# Patient Record
Sex: Male | Born: 1955 | Race: Black or African American | Hispanic: No
Health system: Southern US, Community
[De-identification: ages and names within clinical notes are randomized; demographics above are authoritative.]

---

## 2000-11-08 ENCOUNTER — Emergency Department (HOSPITAL_COMMUNITY): Admission: EM | Admit: 2000-11-08 | Discharge: 2000-11-08 | Payer: Self-pay | Admitting: Emergency Medicine

## 2001-12-15 ENCOUNTER — Emergency Department (HOSPITAL_COMMUNITY): Admission: EM | Admit: 2001-12-15 | Discharge: 2001-12-15 | Payer: Self-pay | Admitting: Emergency Medicine

## 2002-01-17 ENCOUNTER — Emergency Department (HOSPITAL_COMMUNITY): Admission: EM | Admit: 2002-01-17 | Discharge: 2002-01-17 | Payer: Self-pay | Admitting: Emergency Medicine

## 2004-07-09 ENCOUNTER — Encounter: Payer: Self-pay | Admitting: Family Medicine

## 2004-07-25 ENCOUNTER — Emergency Department: Payer: Self-pay | Admitting: Internal Medicine

## 2004-08-07 ENCOUNTER — Encounter: Payer: Self-pay | Admitting: Family Medicine

## 2004-09-06 ENCOUNTER — Encounter: Payer: Self-pay | Admitting: Family Medicine

## 2004-12-05 ENCOUNTER — Emergency Department: Payer: Self-pay | Admitting: Internal Medicine

## 2006-07-01 ENCOUNTER — Ambulatory Visit: Payer: Self-pay | Admitting: Internal Medicine

## 2006-07-07 ENCOUNTER — Encounter (HOSPITAL_BASED_OUTPATIENT_CLINIC_OR_DEPARTMENT_OTHER): Admission: RE | Admit: 2006-07-07 | Discharge: 2006-07-21 | Payer: Self-pay | Admitting: Surgery

## 2006-07-08 ENCOUNTER — Ambulatory Visit: Payer: Self-pay | Admitting: Internal Medicine

## 2006-07-08 ENCOUNTER — Ambulatory Visit: Admission: RE | Admit: 2006-07-08 | Discharge: 2006-07-08 | Payer: Self-pay | Admitting: Surgery

## 2006-07-08 ENCOUNTER — Ambulatory Visit: Payer: Self-pay | Admitting: Vascular Surgery

## 2006-07-08 ENCOUNTER — Encounter: Payer: Self-pay | Admitting: Vascular Surgery

## 2006-11-02 ENCOUNTER — Ambulatory Visit: Payer: Self-pay | Admitting: Internal Medicine

## 2006-11-23 ENCOUNTER — Ambulatory Visit: Payer: Self-pay | Admitting: Internal Medicine

## 2006-11-26 ENCOUNTER — Encounter (INDEPENDENT_AMBULATORY_CARE_PROVIDER_SITE_OTHER): Payer: Self-pay | Admitting: Internal Medicine

## 2006-12-02 ENCOUNTER — Encounter (HOSPITAL_BASED_OUTPATIENT_CLINIC_OR_DEPARTMENT_OTHER): Admission: RE | Admit: 2006-12-02 | Discharge: 2006-12-07 | Payer: Self-pay | Admitting: Internal Medicine

## 2006-12-03 ENCOUNTER — Encounter (INDEPENDENT_AMBULATORY_CARE_PROVIDER_SITE_OTHER): Payer: Self-pay | Admitting: Internal Medicine

## 2006-12-07 ENCOUNTER — Encounter (HOSPITAL_BASED_OUTPATIENT_CLINIC_OR_DEPARTMENT_OTHER): Admission: RE | Admit: 2006-12-07 | Discharge: 2006-12-23 | Payer: Self-pay | Admitting: Surgery

## 2006-12-16 ENCOUNTER — Encounter (INDEPENDENT_AMBULATORY_CARE_PROVIDER_SITE_OTHER): Payer: Self-pay | Admitting: Internal Medicine

## 2006-12-22 ENCOUNTER — Ambulatory Visit: Payer: Self-pay | Admitting: Vascular Surgery

## 2007-01-26 ENCOUNTER — Encounter (INDEPENDENT_AMBULATORY_CARE_PROVIDER_SITE_OTHER): Payer: Self-pay | Admitting: Internal Medicine

## 2007-03-11 ENCOUNTER — Ambulatory Visit: Payer: Self-pay | Admitting: Vascular Surgery

## 2008-04-18 ENCOUNTER — Ambulatory Visit: Payer: Self-pay | Admitting: Vascular Surgery

## 2009-10-30 ENCOUNTER — Ambulatory Visit: Payer: Self-pay | Admitting: Vascular Surgery

## 2010-01-22 ENCOUNTER — Ambulatory Visit: Payer: Self-pay | Admitting: Vascular Surgery

## 2010-02-19 ENCOUNTER — Ambulatory Visit: Payer: Self-pay | Admitting: Vascular Surgery

## 2010-02-26 ENCOUNTER — Ambulatory Visit: Payer: Self-pay | Admitting: Vascular Surgery

## 2010-07-22 NOTE — Consult Note (Signed)
NEW PATIENT CONSULTATION   [name], Ernest  DOB:  1955/09/21                                       12/22/2006  ZOXWR#:[id number]   Patient presents today for evaluation of right leg venous ulcer.  He has  a history of this since 2004.  He does not have any history of deep  venous thrombosis and does not have any history of superficial  thrombophlebitis.  He reports having an evaluation at [facility]  regarding this during that time but had no insurance; therefore, was  unable to undergo treatment.  He did have his venous ulcers healed and  now has a recurrent venous ulcer being treated at the [facility] care and  [facility] with weekly Unna boots.  He reports this has been  improving.   PAST MEDICAL HISTORY:  Significant for elevated blood sugars.  He has  not been treated for this.   He does have a family history of diabetes and hypertension in his  mother.   He is single with two children.  He does smoke several cigars a day and  drinks one beer a day.   His weight is 235 pounds.  He is 6 feet 2 inches tall.   He does have no known allergies.   PHYSICAL EXAMINATION:  A well-developed and well-nourished black male  appearing his stated age of 20.  He has no evidence of venous pathology  in his left leg.  He does have marked swelling and tributary  varicosities in the calf.  Does have chronic changes of venous stasis  disease with open ulceration over the medial aspect of his distal right  calf above his medial malleolus.  This does appear to be superficial and  does appear to be healing.  He does have a 2+ dorsalis pedis pulse.   He underwent hand-held duplex by me showing gross reflux in his  saphenous vein.   He will continue his weekly Unna boot treatment.  I will see him again  in two weeks for a formal duplex to evaluate his deep system.  Plan to  see him and discuss options for laser ablation of his saphenous vein at  that time.   Larina Earthly, M.D.  Electronically Signed   TFE/MEDQ  D:  12/22/2006  T:  12/23/2006  Job:  [id number]   cc:   Jake Shark A. Tanda Rockers, M.D.

## 2010-07-22 NOTE — Assessment & Plan Note (Signed)
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  He has done  extremely well and has the usual  amount of soreness in the medial aspect of his thigh over the ablation  site.  He has mild bruising.  He underwent repeat duplex today and this  shows no new ablation of his great saphenous vein with no evidence of  DVT.     Larina Earthly, M.D.  Electronically Signed   TFE/MEDQ  D:  02/26/2010  T:  02/27/2010  Job:  [id number]

## 2010-07-22 NOTE — Assessment & Plan Note (Signed)
[facility]   NAME:  ABDULAI, BLAYLOCK               ACCOUNT NO.:  [id number]   MEDICAL RECORD NO.:  [id number]      DATE OF BIRTH:  Jun 13, 1955   PHYSICIAN:  Theresia Majors. Tanda Rockers, M.D. VISIT DATE:  12/08/2006                                   OFFICE VISIT   SUBJECTIVE:  Ms. Lelon Perla is a 55 year old man who we are seeing for  recurrent stasis ulcer involving the right medial leg.  In the interim,  he has complained of pain, moderate drainage, but no fever.  He  continues to be ambulatory.   OBJECTIVE:  Blood pressure is 127/67, respirations 16, pulse rate 51,  temperature 97.7.  Inspection of the right lower extremity shows chronic  changes of stasis with hyperpigmentation.  There is an excoriated angry  ulcer on the medial aspect of the right leg which was measured,  photographed and cataloged; please refer to the data entries.  The  dorsalis pedis pulse is 3+ and bounding.  There is associated 3+ edema.  The left lower extremity is unremarkable.The interim culture has shown  Staph species only.   ASSESSMENT:  Recurrent stasis ulcer.   PLAN:  We will resume compression wrap with an Radio broadcast assistant.  We will  reevaluate the patient in 1 week.  The patient will ultimately require a vascular consultation to determine  his candidacy for vein ablation.  The immediate key therapeutic  objective will be to avoid secondary infection, to control the edema and  promote re-epithelialization.  There is no evidence of vascular  compromise or infection at this point.  We have given the patient  opportunity to ask questions; he seems to understand the aforementioned  and indicates that he will be compliant.      Harold A. Tanda Rockers, M.D.  Electronically Signed     HAN/MEDQ  D:  12/08/2006  T:  12/09/2006  Job:  [id number]

## 2010-07-22 NOTE — Procedures (Signed)
LOWER EXTREMITY VENOUS REFLUX EXAM   INDICATION:  Right varicose veins.   EXAM:  Using color-flow imaging and pulse Doppler spectral analysis, the  right common femoral, superficial femoral, popliteal, posterior tibial,  greater and lesser saphenous veins are evaluated.  There is evidence  suggesting deep venous insufficiency in the right lower extremity.   The right saphenofemoral junction is not competent with Reflux of  >578milliseconds. The right GSV is not competent with Reflux of  >56milliseconds with the caliber as described below.   The right proximal short saphenous vein demonstrates competency.   GSV Diameter (used if found to be incompetent only)                                            Right    Left  Proximal Greater Saphenous Vein           0.84 cm  cm  Proximal-to-mid-thigh                     0.87 cm  cm  Mid thigh                                 1.09 cm  cm  Mid-distal thigh                          cm       cm  Distal thigh                              1.14 cm  cm  Knee                                      1.21 cm  cm   IMPRESSION:  1. Right greater saphenous vein is not competent with reflux with      >52milliseconds.  2. The right greater saphenous vein is tortuous in the calf.  3. The deep venous system is not competent with Reflux of      >572milliseconds.  4. The right short saphenous vein is competent.   ___________________________________________  Larina Earthly, M.D.   EM/MEDQ  D:  01/22/2010  T:  01/22/2010  Job:  [id number]

## 2010-07-22 NOTE — Assessment & Plan Note (Signed)
OFFICE VISIT   [name], [name]  DOB:  1955-10-05                                       01/22/2010  WUJWJ#:[id number]   This patient presents today for continued evaluation of extensive venous  hypertension in his right leg.  He has worn thigh-high graduated  compression garments but continues to have some pain, specifically over  his calf and also is developing renewed breakdown over his prior healed  venous ulcer on his right medial malleolus.  His ultrasound reveals  gross reflux throughout his great saphenous vein extending in these  varicosities.   I feel that he has clearly failed conservative treatment and recommended  laser ablation of his great saphenous vein for symptom relief.  He has  several varicosities arising off this and we would recommend stab  phlebectomy at the same setting.  He wished to proceed with this as soon  as we can get him scheduled.  He does have pain which is making it  difficult for him around his home with cooking and shopping due to leg  pain and also has had to stop all lower body workouts and exercise due  to leg pain as well.  He understands this is an outpatient treatment  under local anesthesia and we will proceed at his convenience.     Larina Earthly, M.D.  Electronically Signed   TFE/MEDQ  D:  01/22/2010  T:  01/23/2010  Job:  [id number]

## 2010-07-22 NOTE — Assessment & Plan Note (Signed)
[facility]   NAME:  [name], [name]               ACCOUNT NO.:  [id number]   MEDICAL RECORD NO.:  [id number]      DATE OF BIRTH:  Nov 20, 1955   PHYSICIAN:  Theresia Majors. Tanda Rockers, M.D. VISIT DATE:  12/16/2006                                   OFFICE VISIT   SUBJECTIVE:  [name] is a 55 year old man who we are following for a  stasis ulcer involving the right lower extremity.  In the interim, he  has worn an Radio broadcast assistant.  There has been no excessive drainage, malodor,  pain, or fever.  He continues to be ambulatory.   OBJECTIVE:  Blood pressure is 120/82, respirations 18, pulse rate 50,  temperature 98.5.  Inspection of the right ulcer shows that there has  been decrease in volume as well as area.  The wound is clean.  The edema  has been reasonably controlled.  The changes of chronic venous  insufficiency are persistent.  The dorsalis pedis pulse remains  palpable.   ASSESSMENT:  Clinical improvement of stasis ulcer, responding to  compression.   PLAN:  We are re-applying the Unna boot, and we will reevaluate the  patient in 1 week.      Harold A. Tanda Rockers, M.D.  Electronically Signed     HAN/MEDQ  D:  12/16/2006  T:  12/17/2006  Job:  [id number]

## 2010-07-22 NOTE — Assessment & Plan Note (Signed)
[facility]   NAME:  [name], [name]               ACCOUNT NO.:  [id number]   MEDICAL RECORD NO.:  [id number]      DATE OF BIRTH:  Jul 18, 1955   PHYSICIAN:  Maxwell Caul, M.D. VISIT DATE:  12/03/2006                                   OFFICE VISIT   [name] is re-referred here through [facility] for our review of a  wound on his right medial ankle.  This is a patient who was actually  seen here in May who had a stasis ulceration that responded nicely to  compression therapy.  He was discharged ultimately with a prescription  for graded pressure stockings and a suggestion that he consider a  vascular surgeon for vein surgery.  He was not able to afford either the  prescription graded pressure stockings for consideration of a vascular  surgeon.  He tells me that several weeks ago.  This area opened.  It  would seem that this was starting to form on August 26 when last seen in  [facility].  The he tried to treat this himself with a piece of  somebody's Unna, however, the obviously this is not improving.  He  returns today in followup.   WOUND EXAM:  Temperature is 98.5, pulse 51, respirations 16, blood  pressure 122/71.  He is not a diabetic.  The area on the right medial ankle measures 6.5 x  4.1 x 0.1.  The wound area is a regular with several lakes of  epithelialization.  There was some purulent drainage coming from the  medial aspect of the wound that was cultured.  There is significant  surrounding edema with really tremendous varicosities noted.  There is  discoloration around the wound with a darker pigmented area.  I could  not completely exclude cellulitis.  Area was tender but the tenderness  seemed out of proportion to objective findings.  His peripheral pulses  were well palpable.  There was no evidence of any degree of vascular  insufficiency.   IMPRESSION:  1. Venous stasis ulceration.  We applied Silver shield gel and      nonadherent  dressing, and an Unna wrap.  2. Possibility of periwound cellulitis.  I gave him a prescription for      doxycycline.  A culture the area was done.  I am uncertain whether      there is truly cellulitis here are not.  However, I felt that we      should proceed with giving him an antibiotic to be certain of      treatment while we await cultures.   We will see him back on Tuesday of next week in followup.  Prescription  for doxycycline as noted above.           ______________________________  Maxwell Caul, M.D.     MGR/MEDQ  D:  12/03/2006  T:  12/03/2006  Job:  [phone]

## 2010-07-22 NOTE — Assessment & Plan Note (Signed)
OFFICE VISIT   [name], Ernest  DOB:  1955-07-10                                       02/19/2010  WCBJS#:[id number]   The patient presents today for treatment of his venous hypertension in  his right leg.  He had a laser ablation from just below the knee to just  below the saphenofemoral junction on the right of great saphenous vein  and also had stab phlebectomy of 10 to 20 tributary varicosities in his  pretibial area, medial knee and calf.  He had no immediate  complications.  He will be seen again in 1 week with repeat duplex  follow-up.     Larina Earthly, M.D.  Electronically Signed   TFE/MEDQ  D:  02/19/2010  T:  02/20/2010  Job:  [id number]

## 2010-07-22 NOTE — Assessment & Plan Note (Signed)
OFFICE VISIT   [name], [name]  DOB:  09/02/1955                                       10/30/2009  EAVWU#:[id number]   The patient presents today for continued follow-up of his right leg  venous hypertension.  He had a long history of severe venous  hypertension with ulceration.  He has had excoriation recently over his  medial malleolus and is treating this appropriately with Silvadene.  He  does wear his compression garments intermittently which are knee-high.   PHYSICAL EXAM:  Is otherwise unchanged.  Blood pressure is 125/81,  pulses 62, respirations 18.  HEENT is normal.  He does have palpable  dorsalis pedis pulses bilaterally.  He has no venous pathology on  physical examination in the left leg.  He has marked venous hypertension  on the entire medial aspect of his calf on the right.  He has excoriated  area with prior ulceration.   We have fitted him with thigh-high graduated compression garments 23  mmHg.  Instructed him on continued use of Silvadene over the open wound.  Plan to see him in 3 months for continued discussion.  I did image his  vein with SonoSite and he has a very large incompetent right great  saphenous vein feeding into this.  I felt he would be an excellent  candidate for ablation for improvement of venous hypertension.  We will  see him again in 3 months with follow-up and for a formal duplex.     Larina Earthly, M.D.  Electronically Signed   TFE/MEDQ  D:  10/30/2009  T:  10/31/2009  Job:  [id number]

## 2010-07-22 NOTE — Assessment & Plan Note (Signed)
OFFICE VISIT   [name], Ernest  DOB:  1955-03-27                                       03/11/2007  ONGEX#:[id number]   The patient presents today for continued followup of his severe venous  hypertension, right leg.  He has, fortunately, been able to heal the  ulcer at the medial aspect of his right ankle.  He does have severe  venous hypertension with marked skin changes.  He has worn compression  garments in the past, but was unable to tolerate these due to pain and  pressure.  He has not been wearing Unna boot since the healing of his  ulcer.  He underwent a formal duplex today in our office, which reveals  gross reflux throughout the saphenous vein feeding into the marked  varicosities in his calf.  I discussed options with the patient.  I feel  that he is an excellent candidate for laser ablation of his right  greater saphenous vein and stab phlebectomy of tributaries for reduction  of venous hypertension.  I explained that this would not reverse the  skin changes that he had, but should be able to reduce his risk for  progression of change of venous stasis disease.  He understands and  wishes to proceed.  I have discussed financing due to his status, and we  will proceed once we have arranged coverage with him.   Larina Earthly, M.D.  Electronically Signed   TFE/MEDQ  D:  03/11/2007  T:  03/14/2007  Job:  [id number]

## 2010-07-22 NOTE — Procedures (Signed)
LOWER EXTREMITY VENOUS REFLUX EXAM   INDICATION:  Right lower extremity varicose veins with ulcer.   EXAM:  Using color-flow imaging and pulse Doppler spectral analysis, the  right common femoral, superficial femoral, popliteal, posterior tibial,  greater and lesser saphenous veins are evaluated.  There is evidence  suggesting deep venous insufficiency in the right common femoral vein  and proximal superficial femoral vein.   The right saphenofemoral junction is not competent.  The right greater  saphenous vein is not competent with the caliber as described below.   The right proximal short saphenous vein demonstrates competency.   GSV Diameter (used if found to be incompetent only)                                            Right    Left  Proximal Greater Saphenous Vein           1.01 cm  cm  Proximal-to-mid-thigh                     1.23 cm  cm  Mid thigh                                 0.99 cm  cm  Mid-distal thigh                          0.97 cm  cm  Distal thigh                              0.92 cm  cm  Knee                                      0.93 cm  cm   IMPRESSION:  1. The right greater saphenous vein reflux is identified with the      caliber ranging from 0.92 cm to 1.23 cm knee to groin.  2. The right greater saphenous vein is not aneurysmal.  3. The right greater saphenous vein is not tortuous in the thigh.  4. The deep venous system is competent except for the right common      femoral vein and proximal superficial femoral vein.  5. The right lesser saphenous vein is competent.  6. No evidence of deep venous thrombosis or superficial venous      thrombosis in the right lower extremity.   ___________________________________________  Larina Earthly, M.D.   AS/MEDQ  D:  03/11/2007  T:  03/11/2007  Job:  [id number]

## 2010-07-22 NOTE — Procedures (Signed)
DUPLEX DEEP VENOUS EXAM - LOWER EXTREMITY   INDICATION:  Followup from right greater saphenous vein ablation.   HISTORY:  Edema:  No.  Trauma/Surgery:  Right greater saphenous vein ablation, 02/19/2010.  Pain:  Yes.  PE:  No.  Previous DVT:  No.  Anticoagulants:  No.  Other:   DUPLEX EXAM:                CFV   SFV   PopV  PTV    GSV                R  L  R  L  R  L  R   L  R  L  Thrombosis    o  o  o     o     o      +  Spontaneous   +  +  +     +     +      o  Phasic        +  +  +     +     +      o  Augmentation  +  +  +     +     +      o  Compressible  +  +  +     +     +      o  Competent     +  +  +     +     +      +   Legend:  + - yes  o - no  p - partial  D - decreased   IMPRESSION:  No evidence of acute deep venous thrombosis.  Successful  right greater saphenous vein ablation from the saphenofemoral junction  to the distal insertion site.  I discussed these findings with Dr.  Arbie Cookey.    _____________________________  Larina Earthly, M.D.   OD/MEDQ  D:  02/26/2010  T:  02/26/2010  Job:  [id number]

## 2010-07-22 NOTE — Assessment & Plan Note (Signed)
OFFICE VISIT   [name], [name]  DOB:  05-07-1955                                       04/18/2008  UJWJX#:[id number]   The patient presents today for continued followup of his right leg  venous hypertension.  I had seen him in prior evaluations in October  2008 and January of 2009.  In January of 2009 he underwent noninvasive  vascular laboratory studies which reveals reflux in his great saphenous  vein throughout its course.  He did have incompetence in his right  common femoral vein and proximal superficial femoral vein.  I had  discussed the options of laser ablation and stab phlebectomy and he was  to continue his compression.  Unfortunately since my last visit with him  he is incarcerated.  He sees me today having been transported here from  [facility] for this visit.  He does have a very superficial ulceration  over the medial aspect of his above malleolus ankle on the right.  This  is not infected and is very superficial.  He has been keeping a gauze  over this.  He is wearing very light grade TED stockings.  He has not  worn any true compression.  He does continue to have saphenous  varicosities above this.  I discussed with the patient the mainstay of  treatment would be better compression.  He is written a prescription for  Silvadene and we have given him a pair of knee high 20-30 mmHg graduated  compression garments.  I explained that with him being in [facility]  it is very difficult to proceed with an elective outpatient procedure  with concern regarding potential complications and also followup.  I  have recommended that he continue conservative treatment until he is no  longer incarcerated at which time it will be more convenient for him to  be treated.  I explained that the mainstay of treatment will be  compression for ulcer healing.  He understands this and we will see him  again in 1 year at which time he reports that he will no longer  be  incarcerated.   Larina Earthly, M.D.  Electronically Signed   TFE/MEDQ  D:  04/18/2008  T:  04/19/2008  Job:  [id number]

## 2010-07-25 NOTE — Consult Note (Signed)
NAME:  [name]               ACCOUNT NO.:  [id number]   MEDICAL RECORD NO.:  [id number]          PATIENT TYPE:  REC   LOCATION:  FOOT                         FACILITY:  [facility]   PHYSICIAN:  Harold A. Tanda Rockers, M.D.DATE OF BIRTH:  [birth year]   DATE OF CONSULTATION:  07/08/2006  DATE OF DISCHARGE:                                 CONSULTATION   SUBJECTIVE:  [name] is a 55 year old male who is referred by Dr.  Delrae Alfred from [facility] for evaluation of venous ulcer in the left  leg.   IMPRESSION:  Venous hypertension with stasis ulceration.   RECOMMENDATIONS:  A duplex scan to rule out perforator saphenofemoral  incompetence and to assess the need for surgical intervention.  In the  meantime, we will treat the patient with external compression utilizing  an Radio broadcast assistant.   SUBJECTIVE:  The patient is a 55 year old man who has had discoloration,  swelling, itching, and weeping of the left lower extremity for the past  5-6 years.  This has become progressive over the last several months.  He has lost several jobs due to the fact that his leg swells, itches,  and interferes with his ambulation.  He has trouble with severe pain.  He denies shortness of breath or hemoptysis.  He does not smoke.  He has  been seen on multiple occasions at [facility] with similar complaints.  He has been treated with an Radio broadcast assistant in the past.  He has been  evaluated by the Vascular Service at [facility] and was offered  ablation of the vein by percutaneous technique.  He was unable to  proceed with the surgery due to financial reasons.  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  His past medical history  is remarkable for no known allergies.  His medication includes  hydrocodone 5/550 one to two p.r.n. for pain.  He denies previous  surgery.  His family history is positive for diabetes.  Socially,  he is  divorced.  He has adult children who live locally   REVIEW OF SYSTEMS:  Specifically negative for angina pectoris.  He does  have some dyspepsia prior to and after a big meal.  His weight has been  stable.  He denies polydipsia or polyphagia.  He has no bowel or bladder  complaints.  The remainder of the review of systems is negative.   PHYSICAL EXAMINATION:  GENERAL:  He is an alert, oriented, cooperative  man in no acute distress.  VITAL SIGNS:  His blood pressure is 122/72, respirations 16, pulse rate  50.  Temperature is 97.8.  HEENT:  Clear.  NECK:  Supple.  Trachea is midline.  Thyroid is nonpalpable.  LUNGS:  Clear.  The heart sounds were normal.  The pulse rate on my  repeat exam is 68.  ABDOMEN:  Soft.  EXTREMITIES:  Remarkable for severe changes of stasis involving the  right lower extremity.  On the medial aspect of the lower extremity in  the gaiter  area there is intense desquamation and weeping, but there is  no frank ulceration.  The pedal pulse is 3+ bilaterally.  The patient  retains protective sensation.  There is associated 2+ edema in the right  lower extremity.  The left lower extremity is essentially normal.   DISCUSSION:  This 55 year old man is having significant difficulty  keeping a job due to the symptoms related to venous hypertension,  stasis, cellulitis, and ulceration.  He apparently has been thoroughly  evaluated at [facility] and has been offered surgical intervention  presumably for severe and documented venous insufficiency with reflux.  Initially, we will control his symptoms with external compression.  We  would document his pathology with a  venous duplex.  We will also recommend that he be seen locally by  Vascular Surgery for consideration for venous surgery.  We have  explained this approach to the patient in terms that he seems to  understand.  We are proceeding with a duplex scan prior to placing the  compression wrap.  We will  re-evaluate the patient in 1 week.      Harold A. Tanda Rockers, M.D.  Electronically Signed     HAN/MEDQ  D:  07/08/2006  T:  07/08/2006  Job:  [id number]   cc:   Marcene Duos, M.D.

## 2010-07-25 NOTE — Assessment & Plan Note (Signed)
[facility]   NAME:  [name], [name]               ACCOUNT NO.:  [id number]   MEDICAL RECORD NO.:  [id number]      DATE OF BIRTH:  [birth year]   PHYSICIAN:  Theresia Majors. Tanda Rockers, M.D. VISIT DATE:  07/15/2006                                   OFFICE VISIT   SUBJECTIVE:  [name] is a 55 year old man who we initially saw in  Jul 08, 2006, with a stasis ulcer involving his right lower extremity.  The patient was treated with external compression following a negative  duplex scan, which confirmed the presence of venous insufficiency and  marked venous plethora.  He returns for followup.  He denies interim  pain, swelling, malodorous drainage, or fever.   OBJECTIVE:  Blood pressure is 103/74, respirations 18, pulse rate 62,  temperature 97.9.  Inspection of the right lower extremity shows that  chronic changes of stasis are persistent.  There are prominent  varicosities.  The ulcer, however, is completely resolved.   ASSESSMENT:  Resolved venous stasis ulcer.   PLAN:  We are discharging the patient.  We have given him a consultation  to see the vascular surgeon for consideration of vein surgery.  We have  also provided him with prescriptions for a 30- to 40-mm gradient  stocking.  We have explained the main treatment of venous stasis and  ulceration to the patient in terms that he seems to understand.  He  expresses gratitude for having been seen in this clinic.  The patient is  currently pursuing assistance to social services, so as to promote his  cross-training and on disability secondary to his severe venous disease.  We have indicated that we will assist him within our means.      Harold A. Tanda Rockers, M.D.  Electronically Signed     HAN/MEDQ  D:  07/15/2006  T:  07/15/2006  Job:  [id number]

## 2020-02-11 ENCOUNTER — Other Ambulatory Visit: Payer: Self-pay

## 2020-02-11 ENCOUNTER — Emergency Department (HOSPITAL_COMMUNITY)
Admission: EM | Admit: 2020-02-11 | Discharge: 2020-02-11 | Disposition: A | Discharge status: Home / Self Care | Payer: Medicare Other | Attending: Emergency Medicine | Admitting: Emergency Medicine

## 2020-02-11 ENCOUNTER — Encounter (HOSPITAL_COMMUNITY): Payer: Self-pay

## 2020-02-11 DIAGNOSIS — S46819A Strain of other muscles, fascia and tendons at shoulder and upper arm level, unspecified arm, initial encounter: Secondary | ICD-10-CM

## 2020-02-11 DIAGNOSIS — Y92410 Unspecified street and highway as the place of occurrence of the external cause: Secondary | ICD-10-CM | POA: Diagnosis not present

## 2020-02-11 DIAGNOSIS — M25511 Pain in right shoulder: Secondary | ICD-10-CM | POA: Diagnosis not present

## 2020-02-11 DIAGNOSIS — M25512 Pain in left shoulder: Secondary | ICD-10-CM | POA: Insufficient documentation

## 2020-02-11 DIAGNOSIS — M545 Low back pain, unspecified: Secondary | ICD-10-CM | POA: Diagnosis present

## 2020-02-11 DIAGNOSIS — S39012A Strain of muscle, fascia and tendon of lower back, initial encounter: Secondary | ICD-10-CM

## 2020-02-11 DIAGNOSIS — R030 Elevated blood-pressure reading, without diagnosis of hypertension: Secondary | ICD-10-CM

## 2020-02-11 MED ORDER — ACETAMINOPHEN 500 MG PO TABS
1000.0000 mg | ORAL_TABLET | Freq: Once | ORAL | Status: AC
  Administered 2020-02-11: 1000 mg via ORAL
  Filled 2020-02-11: qty 2

## 2020-02-11 MED ORDER — METHOCARBAMOL 750 MG PO TABS: 750.0000 mg | ORAL_TABLET | Freq: Three times a day (TID) | ORAL | 0 refills | Status: AC | PRN

## 2020-02-11 MED ORDER — METHOCARBAMOL 500 MG PO TABS
750.0000 mg | ORAL_TABLET | Freq: Once | ORAL | Status: AC
  Administered 2020-02-11: 750 mg via ORAL
  Filled 2020-02-11: qty 2
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
## 2020-02-11 NOTE — Discharge Instructions (Addendum)
It was our pleasure to provide your ER care today - we hope that you feel better.  Rest. Try heat therapy or gentle massage to sore area.   Take acetaminophen as need for pain. You may also take robaxin as need for muscle pain/spasm - no driving when taking.   Follow up with primary care doctor this coming week for recheck, and also recheck of blood pressure as it is high tonight.   Return to ER if worse, new symptoms, new or severe pain, numbness/weakness, chest pain, trouble breathing, or other emergency concern.

## 2021-05-17 ENCOUNTER — Encounter (HOSPITAL_COMMUNITY): Payer: Self-pay

## 2021-05-17 ENCOUNTER — Emergency Department (HOSPITAL_COMMUNITY): Payer: Medicare Other

## 2021-05-17 ENCOUNTER — Emergency Department (HOSPITAL_COMMUNITY)
Admission: EM | Admit: 2021-05-17 | Discharge: 2021-05-17 | Disposition: A | Discharge status: Home / Self Care | Payer: Medicare Other | Attending: Emergency Medicine | Admitting: Emergency Medicine

## 2021-05-17 DIAGNOSIS — N390 Urinary tract infection, site not specified: Secondary | ICD-10-CM | POA: Insufficient documentation

## 2021-05-17 DIAGNOSIS — R102 Pelvic and perineal pain: Secondary | ICD-10-CM

## 2021-05-17 LAB — URINALYSIS, ROUTINE W REFLEX MICROSCOPIC
Bilirubin Urine: NEGATIVE
Glucose, UA: NEGATIVE mg/dL
Ketones, ur: NEGATIVE mg/dL
Nitrite: NEGATIVE
Protein, ur: 30 mg/dL — AB
RBC / HPF: >50 RBC/hpf — ABNORMAL HIGH (ref 0–5)
Specific Gravity, Urine: 1.019 (ref 1.005–1.030)
pH: 5 (ref 5.0–8.0)

## 2021-05-17 IMAGING — CT CT RENAL STONE PROTOCOL
2 of 4 series · 15 of 46 positions shown, 17 images · non-contrast
Comparison: None.

CLINICAL DATA: 65-year-old male with hematuria.



[Series 2: axial st · axial · 0.74mm/px · z∈[-485,-105]mm · 12 of 85 slices shown, 14 images]
[im 5/85  soft-tissue]
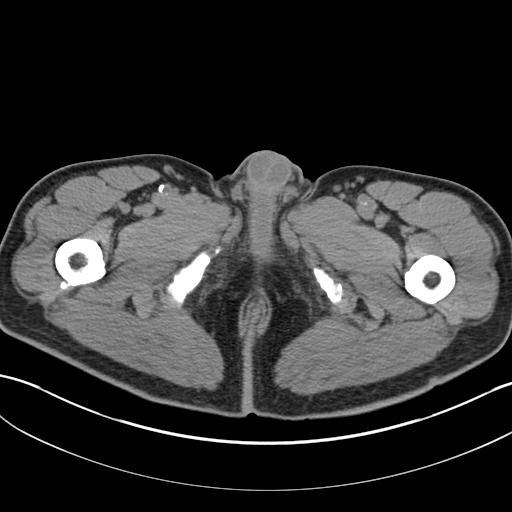
[im 5/85  bone]
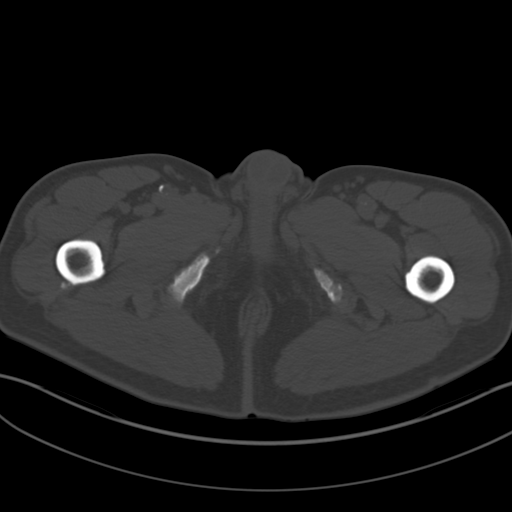
[im 13/85  soft-tissue]
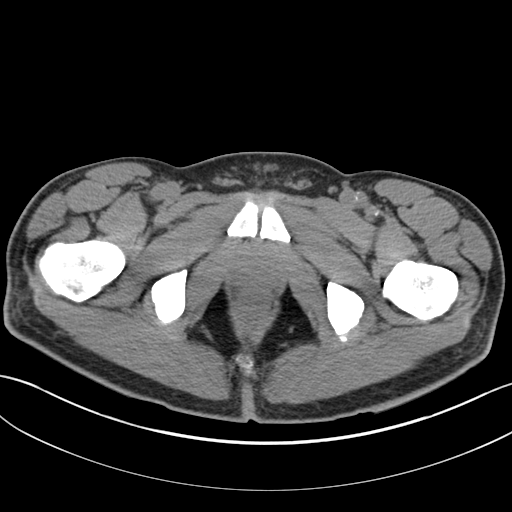
[im 21/85  soft-tissue]
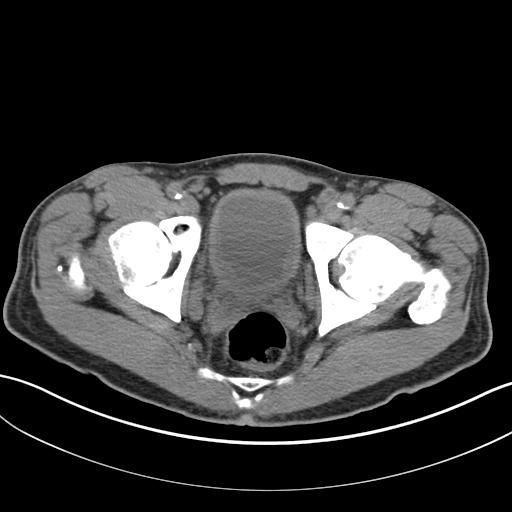
[im 25/85  soft-tissue]
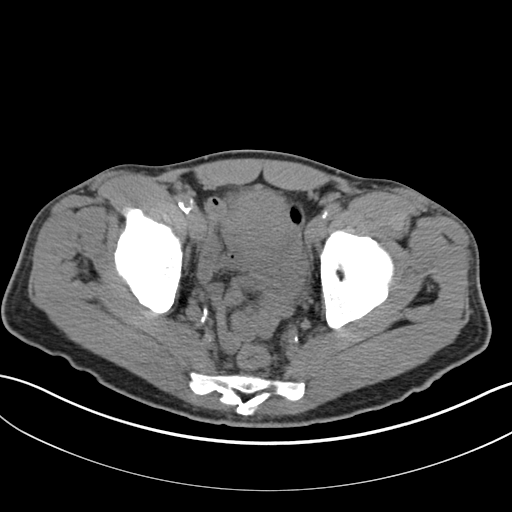
[im 33/85  soft-tissue]
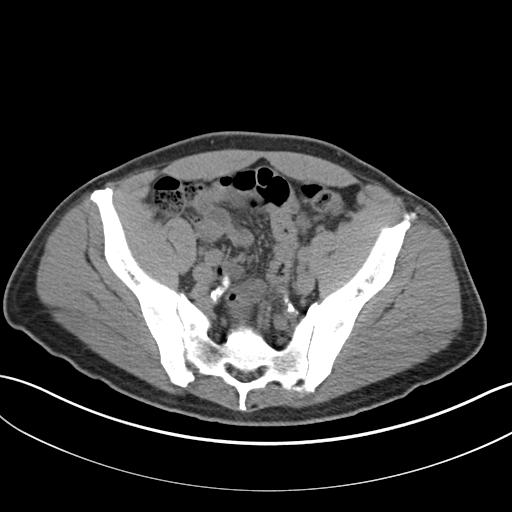
[im 41/85  soft-tissue]
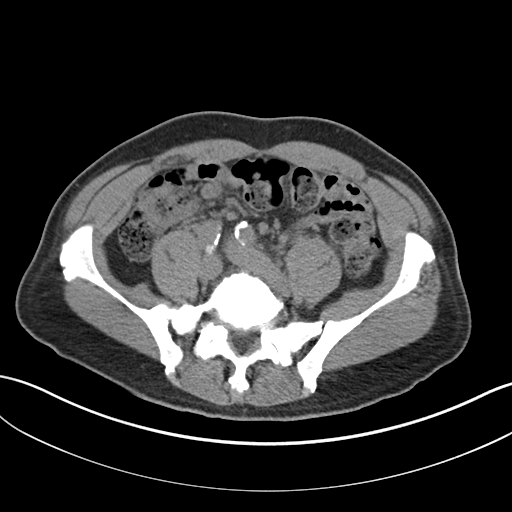
[im 45/85  soft-tissue]
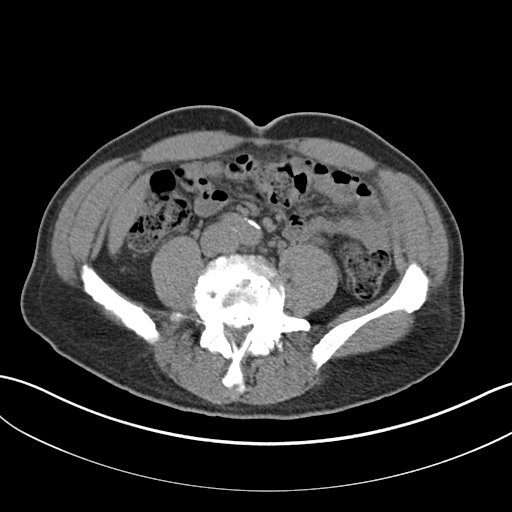
[im 53/85  soft-tissue]
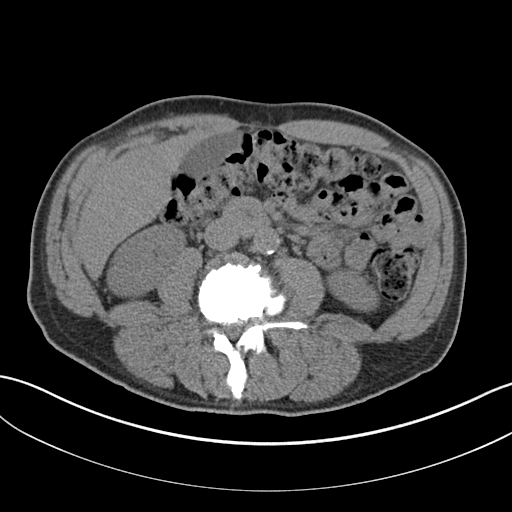
[im 61/85  soft-tissue]
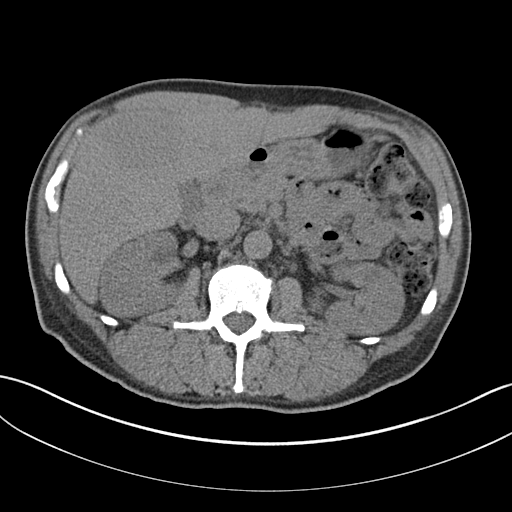
[im 61/85  bone]
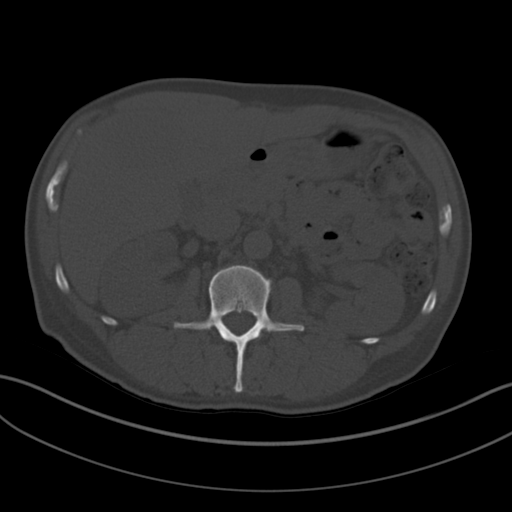
[im 65/85  soft-tissue]
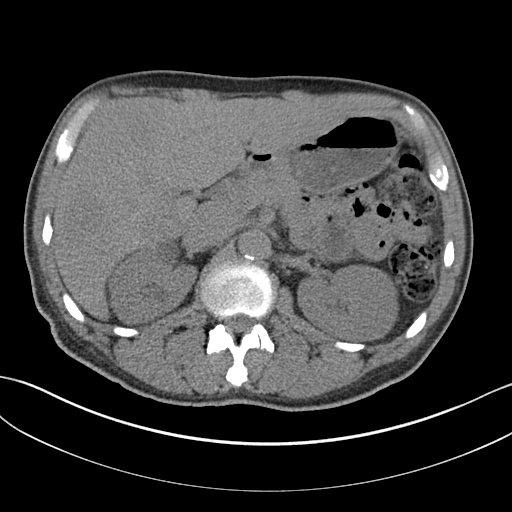
[im 73/85  soft-tissue]
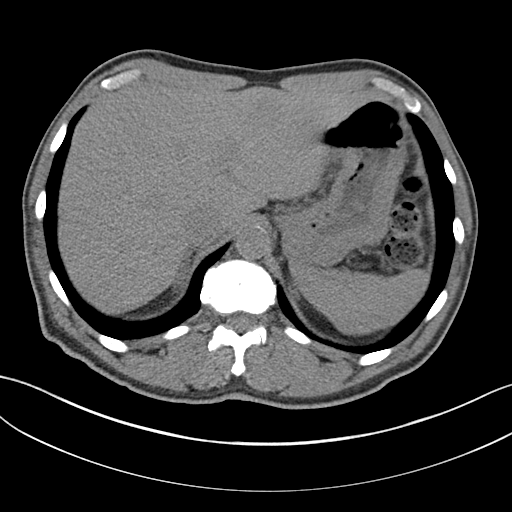
[im 81/85  soft-tissue]
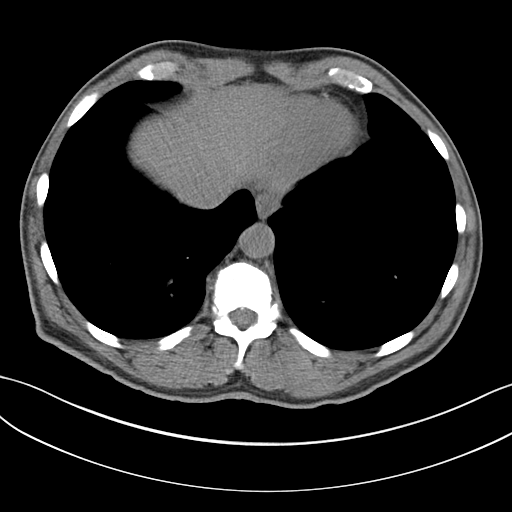

[Series 4: coronal · coronal · 0.70mm/px · 3 of 133 slices shown]
[im 45/133  soft-tissue]
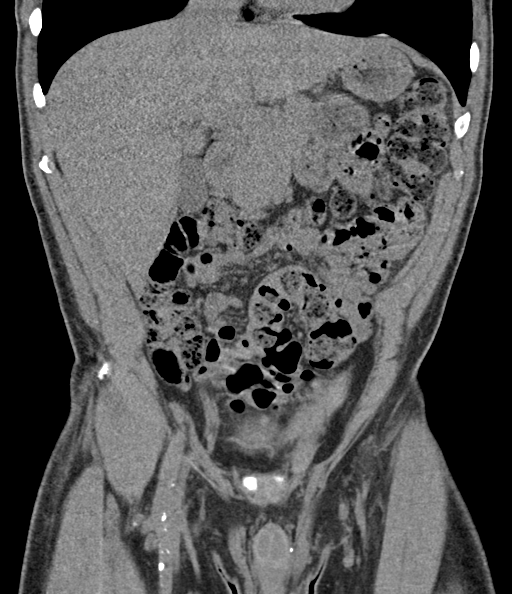
[im 59/133  soft-tissue]
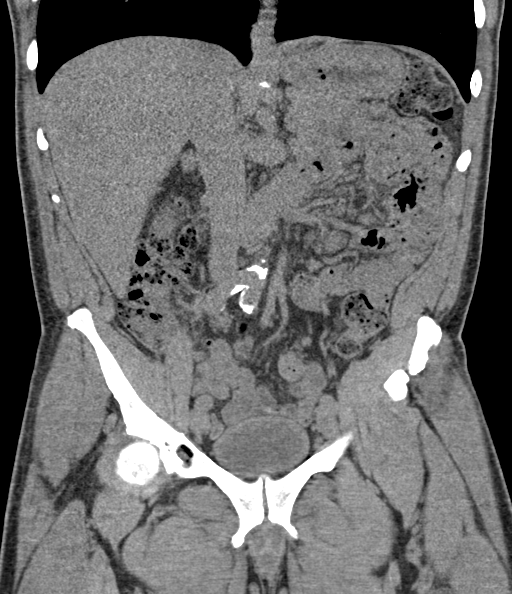
[im 74/133  soft-tissue]
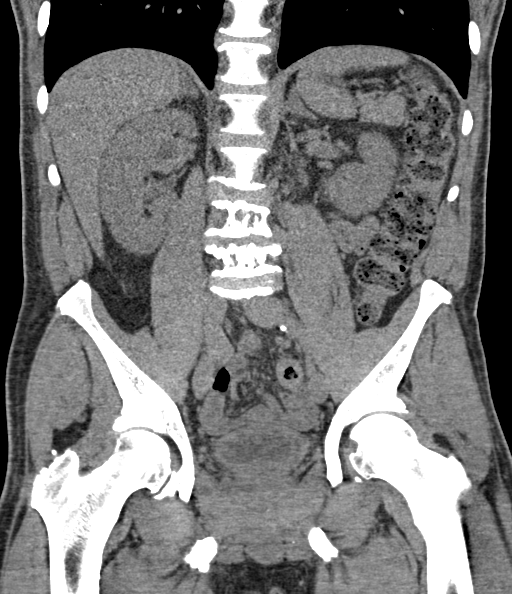

[15 of 46 positions shown; findings below may reference images not displayed]

FINDINGS: Please note that parenchymal and vascular abnormalities may be
missed as intravenous contrast was not administered.

Lower chest: No acute abnormality

Hepatobiliary: There are multiple hypodense lesions within the
liver, the largest measuring 6 cm in the anterior RIGHT liver
(series 2: Image 27).

Pancreas: No definite abnormality.

Spleen: Unremarkable

Adrenals/Urinary Tract: Hypodense lesions within the kidneys include
a 2.1 cm RIGHT renal lesion (series 2: Image 22), a 1.2 cm RIGHT
renal lesion ([DATE]) and a 1.5 cm LEFT renal lesion ([DATE]).

There is no evidence of hydronephrosis or urinary calculi.

Adrenal glands are unremarkable.

Circumferential bladder wall thickening is identified.

Stomach/Bowel: Stomach is within normal limits. Appendix appears
normal. No evidence of bowel wall thickening, distention, or
inflammatory changes.

Vascular/Lymphatic: Aortic atherosclerosis. No enlarged abdominal or
pelvic lymph nodes.

Reproductive: Prostate is unremarkable.

Other: No ascites, focal collection or pneumoperitoneum.

Musculoskeletal: Multiple small lucent lesions within L3, L4 and L5
are noted, indeterminate but may be related to degenerative changes
as moderate degenerative disc disease/spondylosis and endplate
changes are noted at these levels and lucent lesions are not
identified in other vertebra. No acute fracture or subluxation
identified.
IMPRESSION: 1. Multiple hypodense lesions within the liver, the largest
measuring 6 cm in the anterior RIGHT liver, nonspecific but
malignancy or metastatic disease excluded. Further evaluation with
contrast-enhanced CT or MRI is recommended.
2. Hypodense lesions within both kidneys, more likely representing
cyst but recommend contrast enhanced CT or MRI given patient's
hematuria. No hydronephrosis or urinary calculi identified.
3. Circumferential bladder wall thickening. This may reflect
cystitis-correlate clinically and with urinalysis.
4. Multiple small lucent lesions within L3, L4 and L5, indeterminate
but may be related to degenerative changes as moderate degenerative
disc disease/spondylosis and endplate changes primarily at these
levels. Consider further evaluation with MRI.
5. Aortic Atherosclerosis ([B9]-[B9]).

## 2021-05-17 IMAGING — MR MR PELVIS WO/W CM
5 of 8 series · 23 of 48 positions shown · IV contrast (gadavist)
Comparison: Noncontrast CT on [DATE]

CLINICAL DATA: Painless hematuria. Renal, liver, and bone lesions
on recent noncontrast CT.

EXAM:
MRI ABDOMEN AND PELVIS WITHOUT AND WITH CONTRAST
TECHNIQUE: Multiplanar multisequence MR imaging of the abdomen and pelvis was
performed both before and after the administration of intravenous
contrast.
CONTRAST:  8mL GADAVIST GADOBUTROL 1 MMOL/ML IV SOLN

[Series 3: T2 · coronal · 5.0mm · 1.56mm/px · 4 of 30 slices shown (1 of 3)]
[im 1/30]
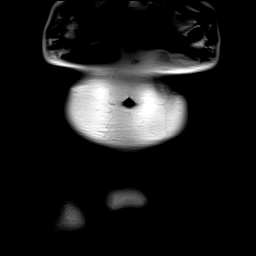
[im 10/30]
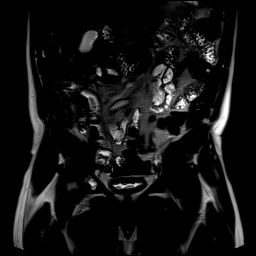
[im 20/30]
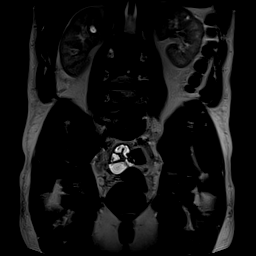
[im 30/30]
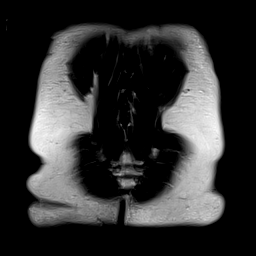

[Series 4: T2 · axial · 5.0mm · 0.70mm/px · z∈[-210,+21]mm · 5 of 34 slices shown (2 of 3)]
[im 1/34]
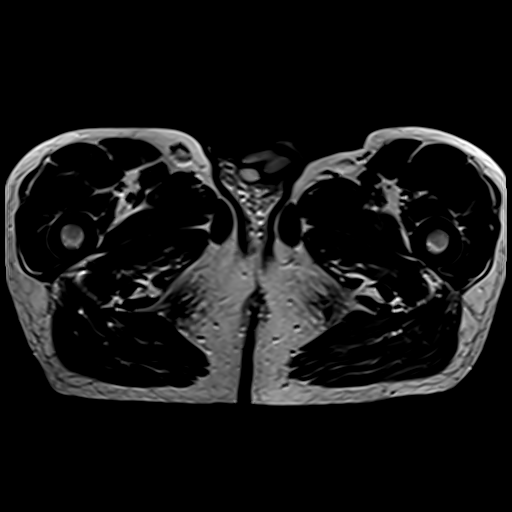
[im 9/34]
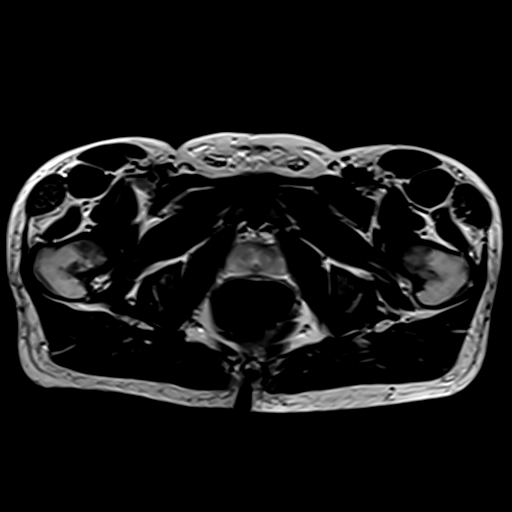
[im 17/34]
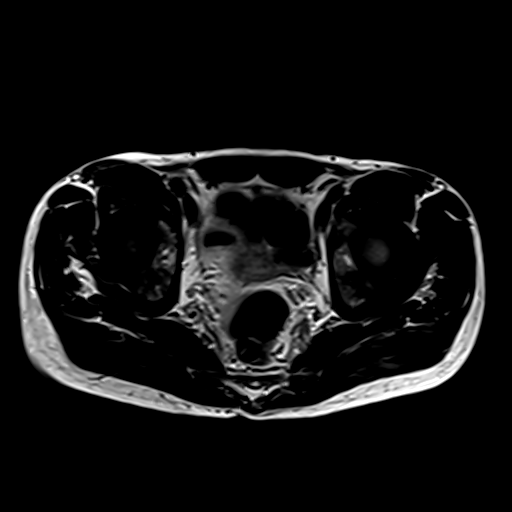
[im 25/34]
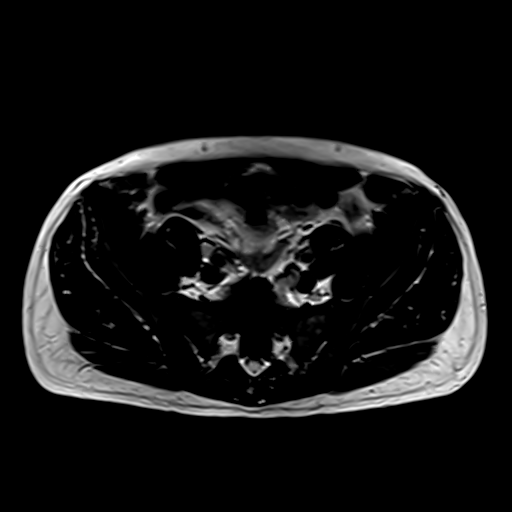
[im 34/34]
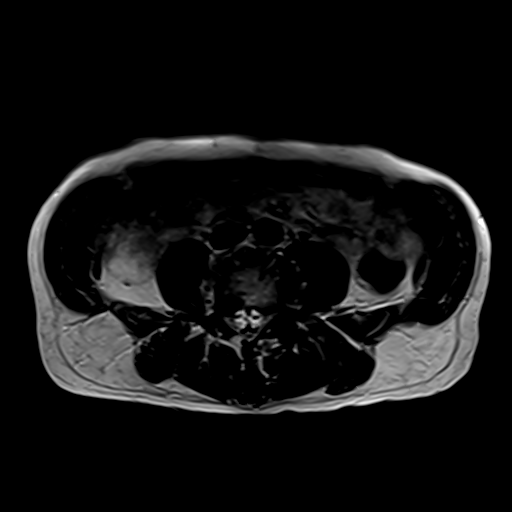

[Series 5: T2 fat-sat · axial · 5.0mm · 0.70mm/px · z∈[-210,+21]mm · 5 of 34 slices shown]
[im 1/34]
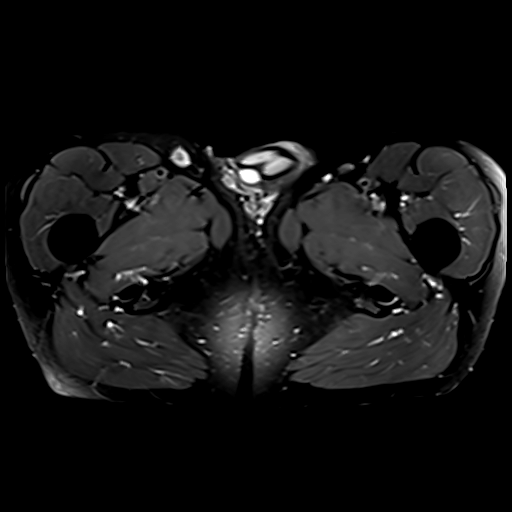
[im 9/34]
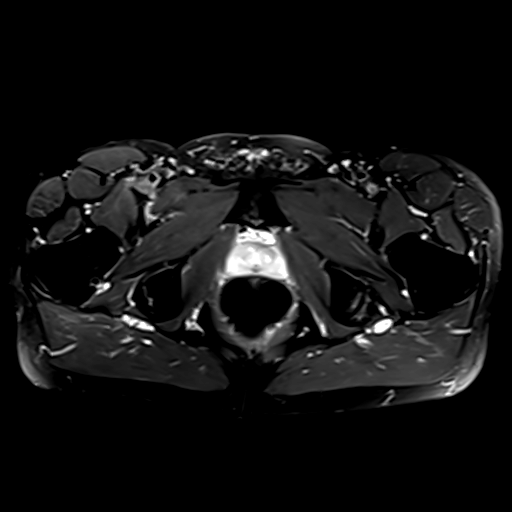
[im 17/34]
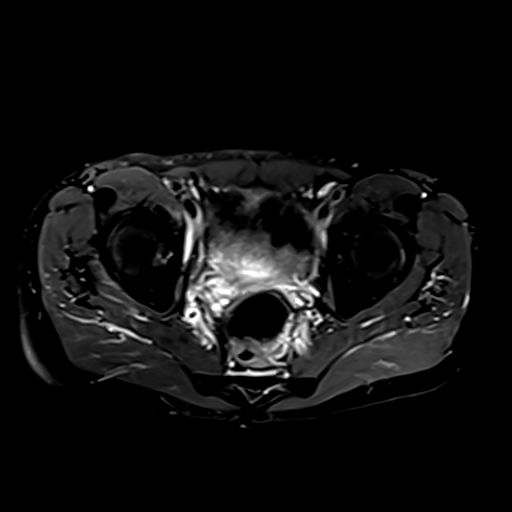
[im 25/34]
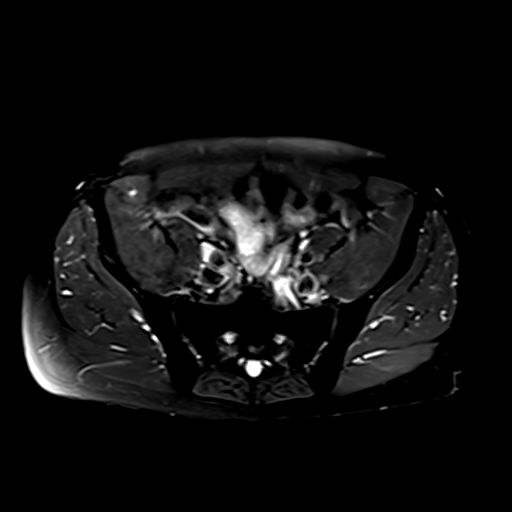
[im 34/34]
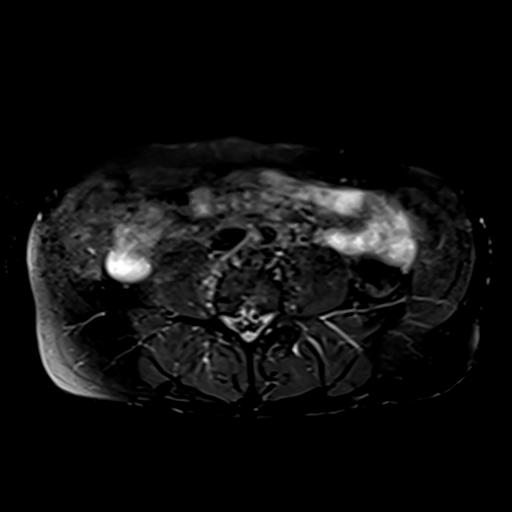

[Series 6: T2 · sagittal · 5.0mm · 0.55mm/px · 7 of 40 slices shown (3 of 3)]
[im 1/40]
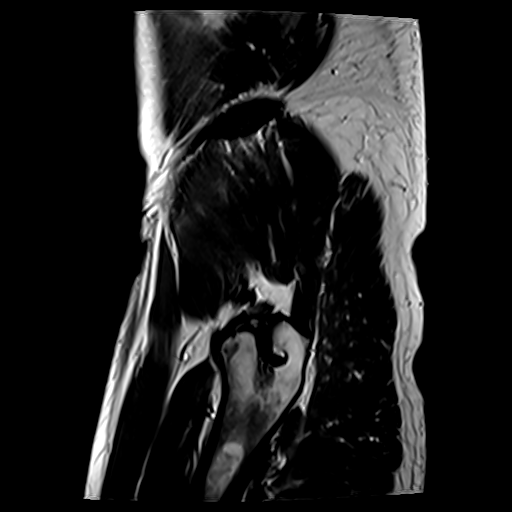
[im 7/40]
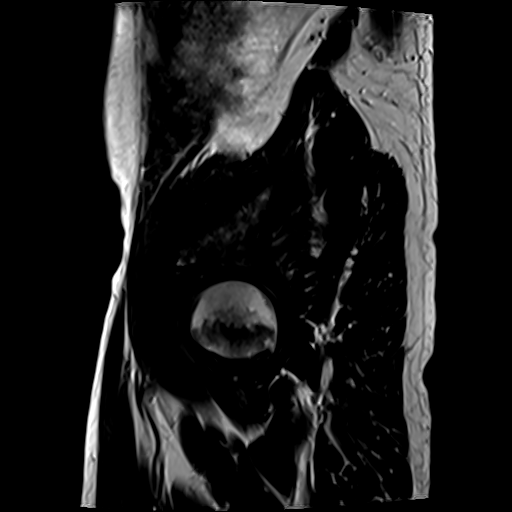
[im 14/40]
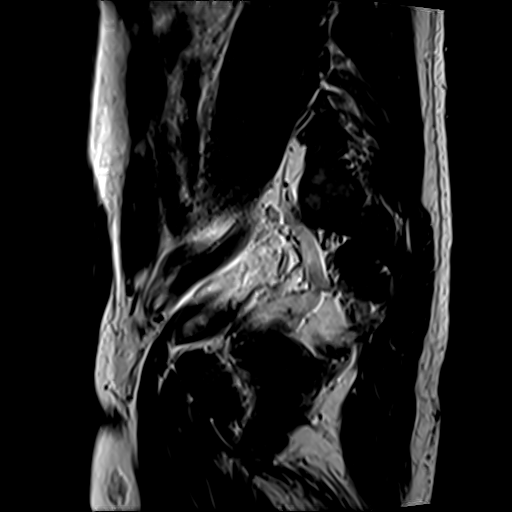
[im 20/40]
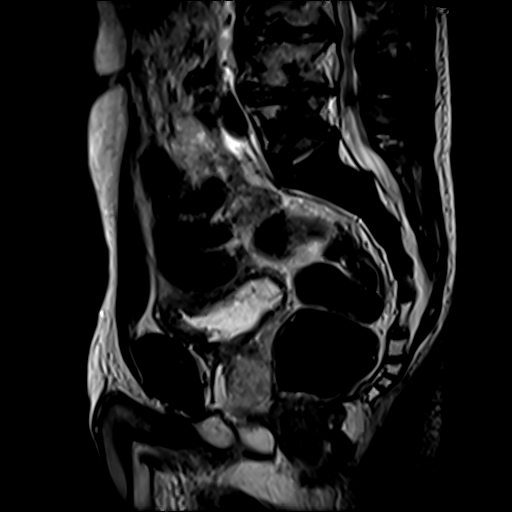
[im 27/40]
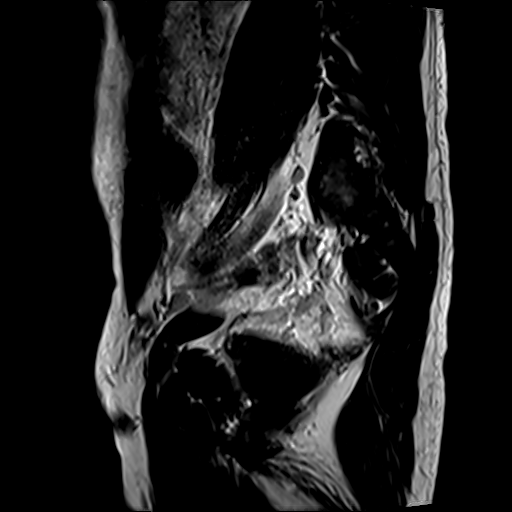
[im 33/40]
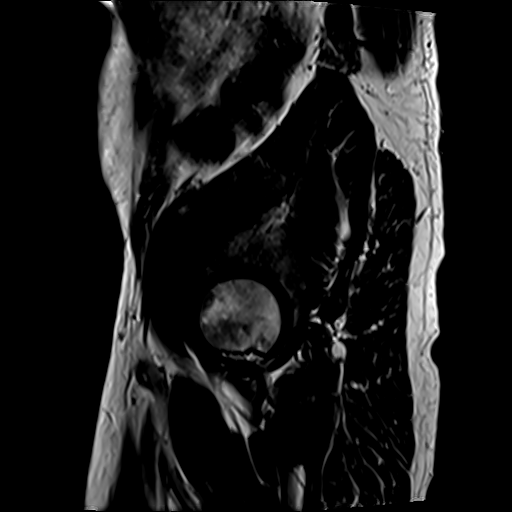
[im 40/40]
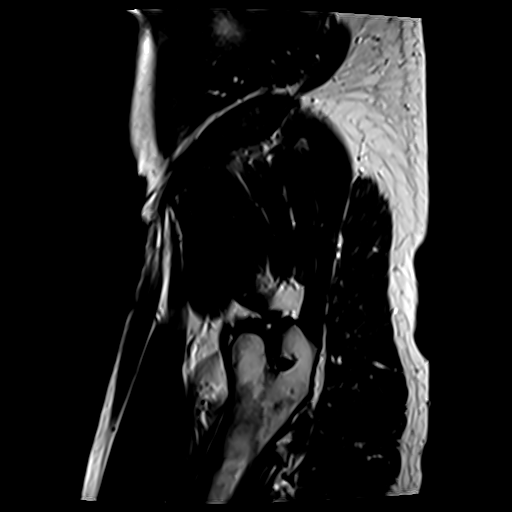

[Series 7: T1 · axial · 5.0mm · 1.12mm/px · z∈[-217,-178]mm · 2 of 41 slices shown]
[im 1/41]
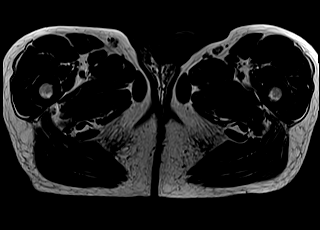
[im 7/41]
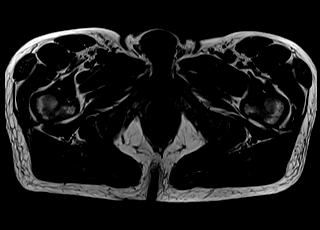

[23 of 48 positions shown; findings below may reference images not displayed]

FINDINGS: COMBINED FINDINGS FOR BOTH MR ABDOMEN AND PELVIS

Lower Chest: No acute findings.

Hepatobiliary: Multiple benign hemangiomas are seen in the right and
left hepatic lobes, which correspond with the liver lesions seen on
recent CT. Largest of these at junction of the right and left lobes
measures 5.9 x 4.4 cm. No other liver masses are identified.
Gallbladder is unremarkable. No evidence of biliary ductal
dilatation.

Pancreas:  No mass or inflammatory changes.

Spleen: Within normal limits in size and appearance.

Adrenals/Urinary Tract: Normal adrenal glands. A few benign
appearing renal cysts are seen bilaterally, however there is no
evidence of renal mass or hydronephrosis.

Stomach/Bowel: No evidence of obstruction, inflammatory process or
abnormal fluid collections.

Vascular/Lymphatic: No pathologically enlarged lymph nodes. No acute
vascular findings.

Reproductive: Normal size prostate gland. No mass or other
significant abnormality.

Other:  None.

Musculoskeletal: Multilevel degenerative disc disease is seen in the
lumbar spine, however no no suspicious bone lesions are identified.
IMPRESSION: No acute findings.  No evidence of malignancy.

Multiple benign hepatic hemangiomas, largest measuring 5.9 cm.

Small benign bilateral renal cysts. No evidence of renal mass or
hydronephrosis.

Lumbar spine degenerative changes. No suspicious bone lesions
identified.

## 2021-05-17 IMAGING — MR MR ABDOMEN WO/W CM
18 series · 48 of 48 positions shown · IV contrast (GADAVIST)
Comparison: Noncontrast CT on [DATE]

CLINICAL DATA: Painless hematuria. Renal, liver, and bone lesions
on recent noncontrast CT.

EXAM:
MRI ABDOMEN AND PELVIS WITHOUT AND WITH CONTRAST
TECHNIQUE: Multiplanar multisequence MR imaging of the abdomen and pelvis was
performed both before and after the administration of intravenous
contrast.
CONTRAST:  8mL GADAVIST GADOBUTROL 1 MMOL/ML IV SOLN

[Series 6: T2 · coronal · 6.0mm · 1.56mm/px · 2 of 30 slices shown (1 of 2)]
[im 1/30]
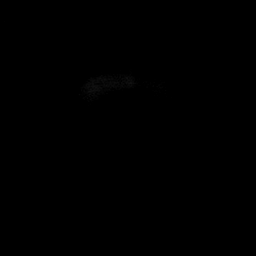
[im 30/30]
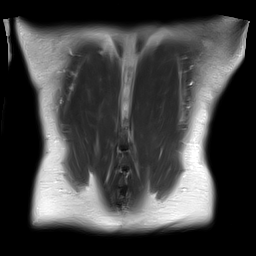

[Series 7: T2 fat-sat · axial · 6.0mm · 1.56mm/px · z∈[-71,+224]mm · 3 of 42 slices shown]
[im 1/42]
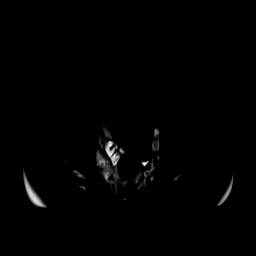
[im 21/42]
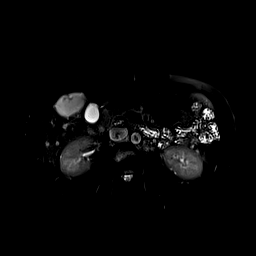
[im 42/42]
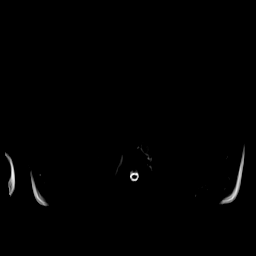

[Series 8: T1 · axial · 3.5mm · 1.25mm/px · z∈[-37,+211]mm · 3 of 72 slices shown (1 of 2)]
[im 1/72]
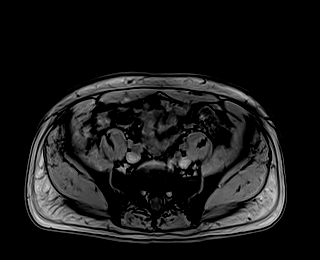
[im 36/72]
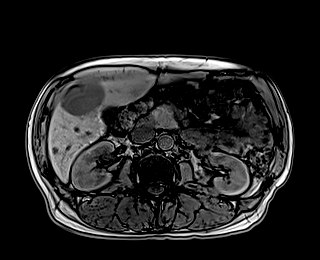
[im 72/72]
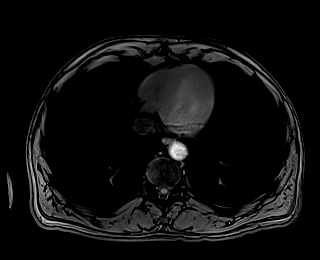

[Series 9: T1 · axial · 3.5mm · 1.25mm/px · z∈[-37,+211]mm · 3 of 72 slices shown (2 of 2)]
[im 1/72]
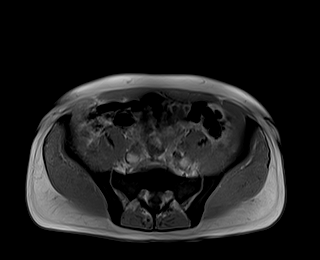
[im 36/72]
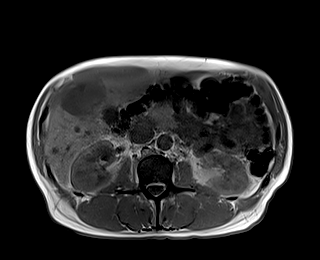
[im 72/72]
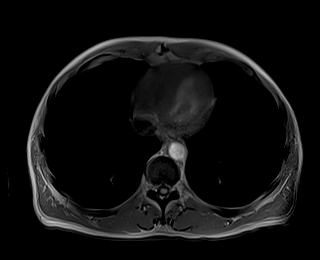

[Series 10: DWI · axial · 6.0mm · 1.49mm/px · z∈[-59,+235]mm · 3 of 72 slices shown (1 of 2)]
[im 1/72]
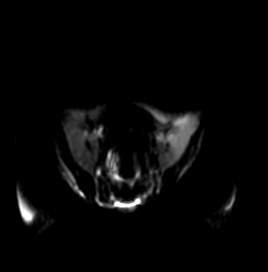
[im 36/72]
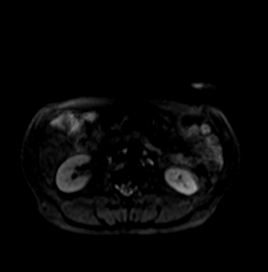
[im 72/72]
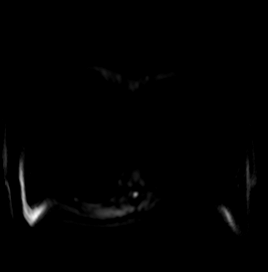

[Series 11: DWI · axial · 6.0mm · 1.49mm/px · 1 of 36 slices shown (2 of 2)]
[im 1/36]
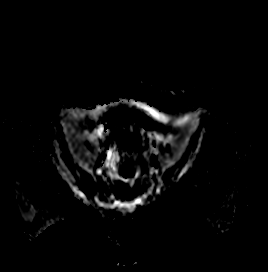

[Series 12: bSSFP · axial · 5.0mm · 0.78mm/px · z∈[-64,+244]mm · 2 of 45 slices shown]
[im 1/45]
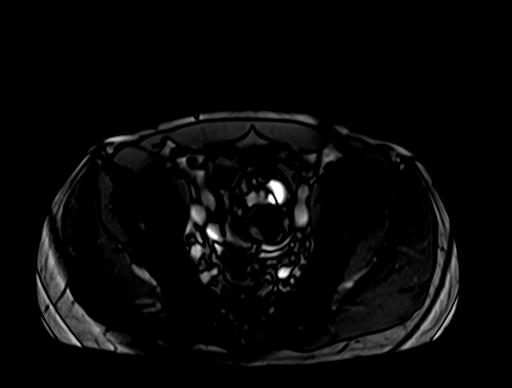
[im 45/45]
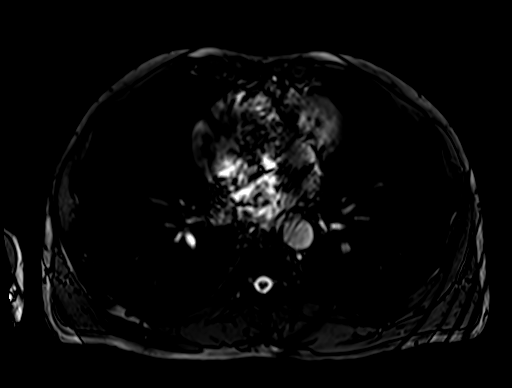

[Series 14: T1 dynamic · axial · 3.0mm · 1.25mm/px · z∈[-60,+225]mm · 3 of 96 slices shown (1 of 10)]
[im 1/96]
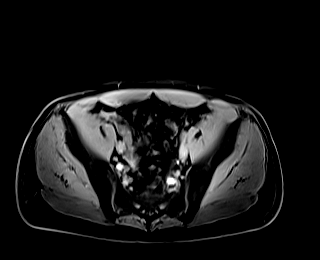
[im 48/96]
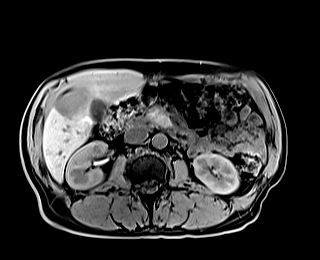
[im 96/96]
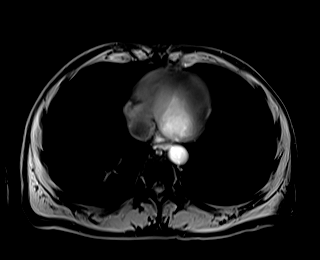

[Series 18: T1 dynamic · axial · 3.0mm · 1.25mm/px · z∈[-60,+225]mm · 3 of 96 slices shown (2 of 10)]
[im 1/96]
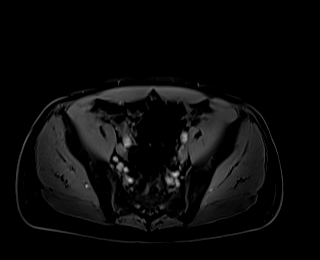
[im 48/96]
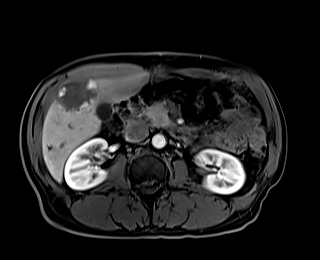
[im 96/96]
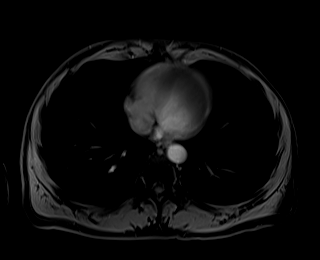

[Series 19: T1 dynamic · axial · 3.0mm · 1.25mm/px · z∈[-60,+225]mm · 3 of 96 slices shown (3 of 10)]
[im 1/96]
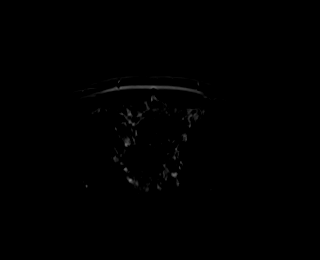
[im 48/96]
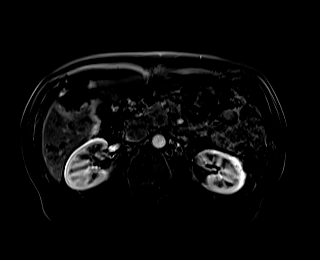
[im 96/96]
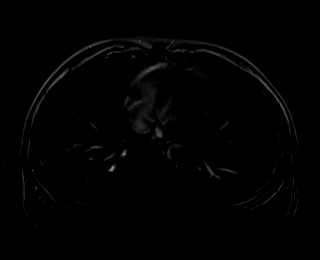

[Series 22: T1 dynamic · axial · 3.0mm · 1.25mm/px · z∈[-60,+225]mm · 3 of 96 slices shown (4 of 10)]
[im 1/96]
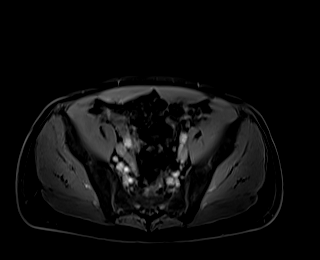
[im 48/96]
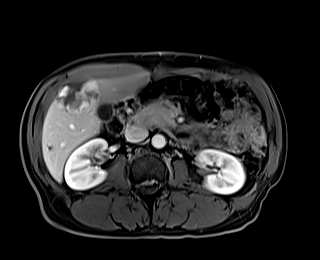
[im 96/96]
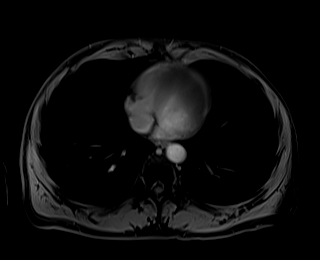

[Series 23: T1 dynamic · axial · 3.0mm · 1.25mm/px · z∈[-60,+225]mm · 3 of 96 slices shown (5 of 10)]
[im 1/96]
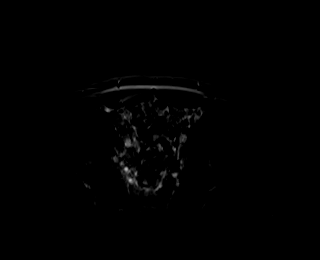
[im 48/96]
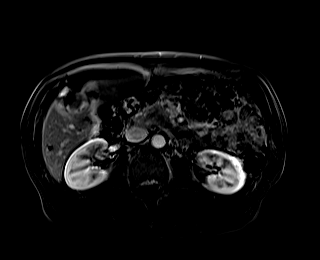
[im 96/96]
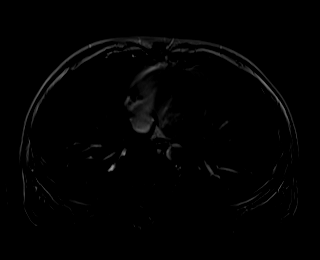

[Series 26: T1 dynamic · axial · 3.0mm · 1.25mm/px · z∈[-60,+225]mm · 3 of 96 slices shown (6 of 10)]
[im 1/96]
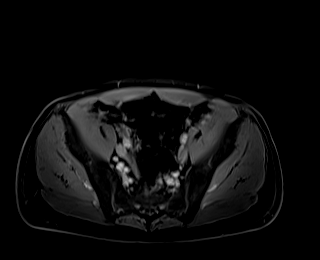
[im 48/96]
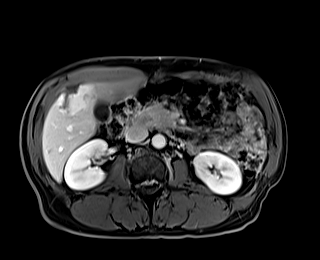
[im 96/96]
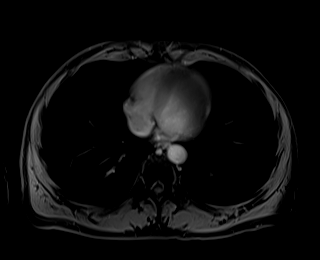

[Series 27: T1 dynamic · axial · 3.0mm · 1.25mm/px · z∈[-60,+225]mm · 3 of 96 slices shown (7 of 10)]
[im 1/96]
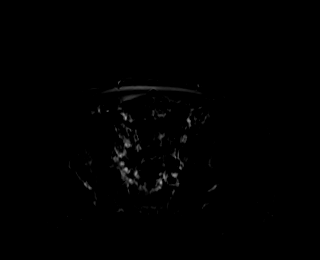
[im 48/96]
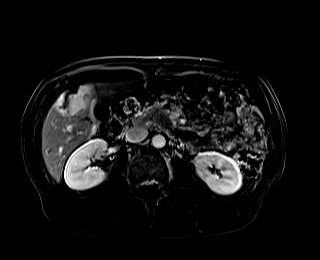
[im 96/96]
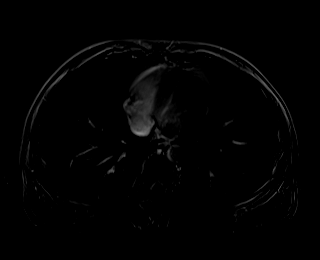

[Series 28: T2 · axial · 6.0mm · 1.56mm/px · 1 of 30 slices shown (2 of 2)]
[im 1/30]
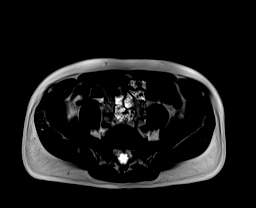

[Series 31: T1 dynamic · axial · 3.0mm · 1.25mm/px · z∈[-60,+225]mm · 3 of 96 slices shown (8 of 10)]
[im 1/96]
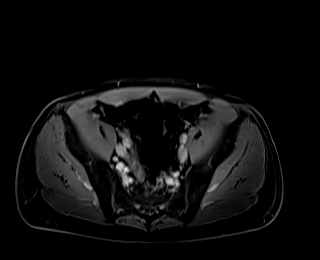
[im 48/96]
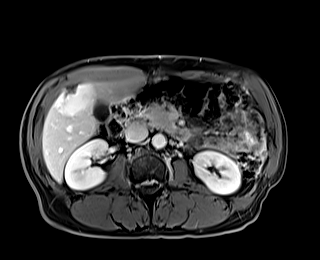
[im 96/96]
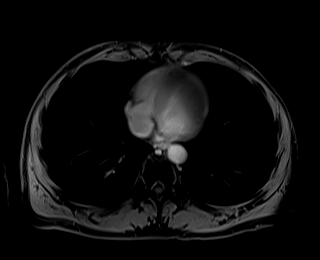

[Series 32: T1 dynamic · axial · 3.0mm · 1.25mm/px · z∈[-60,+225]mm · 3 of 96 slices shown (9 of 10)]
[im 1/96]
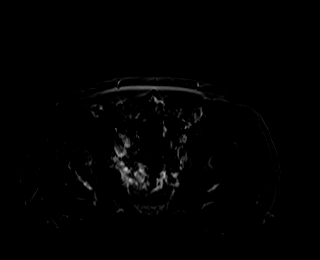
[im 48/96]
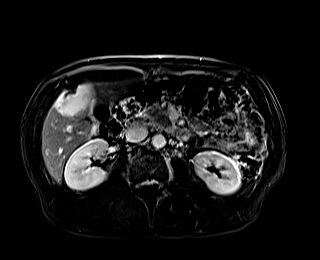
[im 96/96]
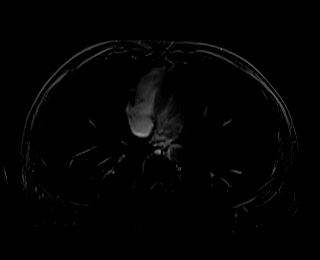

[Series 34: T1 dynamic · coronal · 3.2mm · 1.41mm/px · 3 of 80 slices shown (10 of 10)]
[im 1/80]
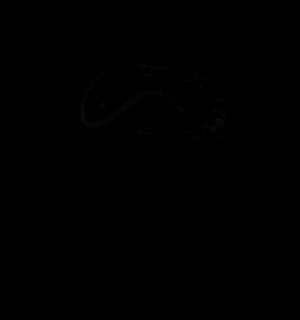
[im 40/80]
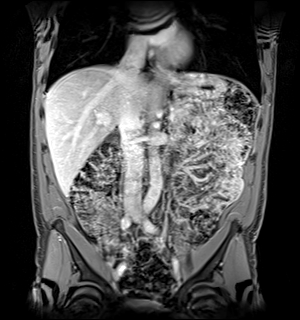
[im 80/80]
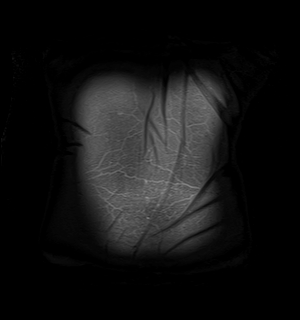

[48 of 48 positions shown; findings below may reference images not displayed]

FINDINGS: COMBINED FINDINGS FOR BOTH MR ABDOMEN AND PELVIS

Lower Chest: No acute findings.

Hepatobiliary: Multiple benign hemangiomas are seen in the right and
left hepatic lobes, which correspond with the liver lesions seen on
recent CT. Largest of these at junction of the right and left lobes
measures 5.9 x 4.4 cm. No other liver masses are identified.
Gallbladder is unremarkable. No evidence of biliary ductal
dilatation.

Pancreas:  No mass or inflammatory changes.

Spleen: Within normal limits in size and appearance.

Adrenals/Urinary Tract: Normal adrenal glands. A few benign
appearing renal cysts are seen bilaterally, however there is no
evidence of renal mass or hydronephrosis.

Stomach/Bowel: No evidence of obstruction, inflammatory process or
abnormal fluid collections.

Vascular/Lymphatic: No pathologically enlarged lymph nodes. No acute
vascular findings.

Reproductive: Normal size prostate gland. No mass or other
significant abnormality.

Other:  None.

Musculoskeletal: Multilevel degenerative disc disease is seen in the
lumbar spine, however no no suspicious bone lesions are identified.
IMPRESSION: No acute findings.  No evidence of malignancy.

Multiple benign hepatic hemangiomas, largest measuring 5.9 cm.

Small benign bilateral renal cysts. No evidence of renal mass or
hydronephrosis.

Lumbar spine degenerative changes. No suspicious bone lesions
identified.

## 2021-05-17 MED ORDER — CEPHALEXIN 500 MG PO CAPS: 500.0000 mg | ORAL_CAPSULE | Freq: Two times a day (BID) | ORAL | 0 refills | Status: AC

## 2021-05-17 MED ORDER — GADOBUTROL 1 MMOL/ML IV SOLN
8.0000 mL | Freq: Once | INTRAVENOUS | Status: AC | PRN
  Administered 2021-05-17: 8 mL via INTRAVENOUS

## 2021-05-17 MED ORDER — CEPHALEXIN 500 MG PO CAPS
500.0000 mg | ORAL_CAPSULE | Freq: Once | ORAL | Status: AC
  Administered 2021-05-17: 500 mg via ORAL
  Filled 2021-05-17: qty 1

## 2021-05-17 NOTE — ED Provider Notes (Signed)
[facility] Provider Note   CSN: [id number] Arrival date & time: 05/17/21  1206     History  Chief Complaint  Patient presents with   Urinary Frequency    Reino KentLevan Flow is a 66 y.o. male.  HPI Patient presents with painful bloody urination.  He was in his usual state of health until yesterday when he noticed polyuria.  Soon thereafter he began having pain towards the end of completing urination, typically around the meatus, and suprapubic region.  After initially abnormal looking urine urine became bloody, though less so after drinking copious fluids.  No flank pain, no chest pain, no dyspnea, no fever.  He notes that he does not go to a doctor, was well prior to the onset of this illness.    Home Medications Prior to Admission medications   Medication Sig Start Date End Date Taking? Authorizing Provider  methocarbamol (ROBAXIN) 750 MG tablet Take 1 tablet (750 mg total) by mouth 3 (three) times daily as needed (muscle spasm/pain). 02/11/20   Cathren LaineSteinl, Kevin, MD      Allergies    Patient has no known allergies.    Review of Systems   Review of Systems  Constitutional:        Per HPI, otherwise negative  HENT:         Per HPI, otherwise negative  Respiratory:         Per HPI, otherwise negative  Cardiovascular:        Per HPI, otherwise negative  Gastrointestinal:  Negative for vomiting.  Endocrine:       Negative aside from HPI  Genitourinary:        Neg aside from HPI   Musculoskeletal:        Per HPI, otherwise negative  Skin: Negative.   Neurological:  Negative for syncope.   Physical Exam Updated Vital Signs BP (!) 137/53    Pulse (!) 55    Temp 98 F (36.7 C) (Oral)    Resp 16    SpO2 100%  Physical Exam Vitals and nursing note reviewed.  Constitutional:      General: He is not in acute distress.    Appearance: He is well-developed.  HENT:     Head: Normocephalic and atraumatic.  Eyes:     Conjunctiva/sclera:  Conjunctivae normal.  Cardiovascular:     Rate and Rhythm: Normal rate and regular rhythm.  Pulmonary:     Effort: Pulmonary effort is normal. No respiratory distress.     Breath sounds: No stridor.  Abdominal:     General: There is no distension.     Tenderness: There is abdominal tenderness.  Skin:    General: Skin is warm and dry.  Neurological:     Mental Status: He is alert and oriented to person, place, and time.    ED Results / Procedures / Treatments   Labs (all labs ordered are listed, but only abnormal results are displayed) Labs Reviewed  URINALYSIS, ROUTINE W REFLEX MICROSCOPIC - Abnormal; Notable for the following components:      Result Value   APPearance CLOUDY (*)    Hgb urine dipstick LARGE (*)    Protein, ur 30 (*)    Leukocytes,Ua MODERATE (*)    RBC / HPF >50 (*)    Bacteria, UA RARE (*)    All other components within normal limits    EKG None  Radiology CT Renal Stone Study  Result Date: 05/17/2021 CLINICAL DATA:  66 year old male  with hematuria. EXAM: CT ABDOMEN AND PELVIS WITHOUT CONTRAST TECHNIQUE: Multidetector CT imaging of the abdomen and pelvis was performed following the standard protocol without IV contrast. RADIATION DOSE REDUCTION: This exam was performed according to the departmental dose-optimization program which includes automated exposure control, adjustment of the mA and/or kV according to patient size and/or use of iterative reconstruction technique. COMPARISON:  None. FINDINGS: Please note that parenchymal and vascular abnormalities may be missed as intravenous contrast was not administered. Lower chest: No acute abnormality Hepatobiliary: There are multiple hypodense lesions within the liver, the largest measuring 6 cm in the anterior RIGHT liver (series 2: Image 27). Pancreas: No definite abnormality. Spleen: Unremarkable Adrenals/Urinary Tract: Hypodense lesions within the kidneys include a 2.1 cm RIGHT renal lesion (series 2: Image 22),  a 1.2 cm RIGHT renal lesion (2:21) and a 1.5 cm LEFT renal lesion (2:24). There is no evidence of hydronephrosis or urinary calculi. Adrenal glands are unremarkable. Circumferential bladder wall thickening is identified. Stomach/Bowel: Stomach is within normal limits. Appendix appears normal. No evidence of bowel wall thickening, distention, or inflammatory changes. Vascular/Lymphatic: Aortic atherosclerosis. No enlarged abdominal or pelvic lymph nodes. Reproductive: Prostate is unremarkable. Other: No ascites, focal collection or pneumoperitoneum. Musculoskeletal: Multiple small lucent lesions within L3, L4 and L5 are noted, indeterminate but may be related to degenerative changes as moderate degenerative disc disease/spondylosis and endplate changes are noted at these levels and lucent lesions are not identified in other vertebra. No acute fracture or subluxation identified. IMPRESSION: 1. Multiple hypodense lesions within the liver, the largest measuring 6 cm in the anterior RIGHT liver, nonspecific but malignancy or metastatic disease excluded. Further evaluation with contrast-enhanced CT or MRI is recommended. 2. Hypodense lesions within both kidneys, more likely representing cyst but recommend contrast enhanced CT or MRI given patient's hematuria. No hydronephrosis or urinary calculi identified. 3. Circumferential bladder wall thickening. This may reflect cystitis-correlate clinically and with urinalysis. 4. Multiple small lucent lesions within L3, L4 and L5, indeterminate but may be related to degenerative changes as moderate degenerative disc disease/spondylosis and endplate changes primarily at these levels. Consider further evaluation with MRI. 5. Aortic Atherosclerosis (ICD10-I70.0). Electronically Signed   By: Harmon Pier M.D.   On: 05/17/2021 14:06    Procedures Procedures    Medications Ordered in ED Medications - No data to display  ED Course/ Medical Decision Making/ A&P  This patient  presents to the ED for concern of polyuria, dysuria suprapubic pain, this involves an extensive number of treatment options, and is a complaint that carries with it a high risk of complications and morbidity.  The differential diagnosis includes cystitis, nephrolithiasis, dehydration, other intra-abdominal processes   Co morbidities that complicate the patient evaluation  None   Social Determinants of Health:  No access to healthcare   Additional history obtained:  Additional history and/or information obtained from chart review, lumbar strain last year, prior vascular surgery issues 10 years ago External records from outside source obtained and reviewed including as above, lumbar strain otherwise no EMR notes   After the initial evaluation, orders, including: Labs, CT were initiated.  Patient placed on Cardiac and Pulse-Oximetry Monitors. The patient was maintained on a cardiac monitor.  The cardiac monitored showed an rhythm of 60 sinus normal The patient was also maintained on pulse oximetry. The readings were typically 100% room air normal  On repeat evaluation of the patient stayed the same  Lab Tests:  I personally interpreted labs.  The pertinent results include: Leukocyte positive,  hematuria positive urine  Imaging Studies ordered:  I independently visualized and interpreted imaging which showed CT with multiple abnormalities including hepatic, renal, bladder and bone issues.  Following discussion of these findings with the patient MRI was indicated, ordered.  I discussed the modality with the radiology tech to ensure appropriate I agree with the radiologist interpretation I also reviewed the MR I results abdomen, pelvis, agree with interpretation, no evidence for mass, malignancy    Dispostion / Final MDM:  After consideration of the diagnostic results and the patient's response to treatment, he initiated IV therapy, and is appropriate for outpatient follow-up.  This  adult male presents with new dysuria, hematuria, suprapubic pain.  After initial evaluation with consideration of cystitis versus nephrolithiasis resulted in abnormal CT finding patient had MRI performed.  These subsequent results did not demonstrate malignancy, mass, tumor.  Patient's labs similarly reassuring, no evidence of bacteremia, sepsis.  Patient has no easy access to outpatient care, thus MRI was indicated here.  Patient appropriate for discharge.  Final Clinical Impression(s) / ED Diagnoses Final diagnoses:  Lower urinary tract infectious disease  Suprapubic pain, acute    Rx / DC Orders ED Discharge Orders          Ordered    cephALEXin (KEFLEX) 500 MG capsule  2 times daily        05/17/21 1805              Gerhard Munch, MD 05/17/21 1825

## 2021-05-17 NOTE — Discharge Instructions (Addendum)
As discussed, you have been diagnosed with an infection.  Please take all medication until completion, monitor your condition carefully, stay well-hydrated and do not hesitate to return here for concerning changes in your condition. ?

## 2021-05-17 NOTE — ED Triage Notes (Signed)
Pt arrived via POV, c/o urinary frequency, urgency, and some blood in urine since yesterday. States blood has lessened, but still there.  ?
